# Patient Record
Sex: Female | Born: 1980 | Race: White | Hispanic: No | Marital: Married | Smoking: Current every day smoker
Health system: Southern US, Community
[De-identification: ages and names within clinical notes are randomized; demographics above are authoritative.]

## PROBLEM LIST (undated history)

## (undated) DIAGNOSIS — K259 Gastric ulcer, unspecified as acute or chronic, without hemorrhage or perforation: Secondary | ICD-10-CM

---

## 2007-06-12 HISTORY — PX: ABDOMINAL HYSTERECTOMY: SHX81

## 2012-05-11 ENCOUNTER — Emergency Department (HOSPITAL_COMMUNITY)
Admission: EM | Admit: 2012-05-11 | Discharge: 2012-05-11 | Disposition: A | Discharge status: Home / Self Care | Payer: No Typology Code available for payment source | Attending: Emergency Medicine | Admitting: Emergency Medicine

## 2012-05-11 ENCOUNTER — Encounter (HOSPITAL_COMMUNITY): Payer: Self-pay | Admitting: Emergency Medicine

## 2012-05-11 ENCOUNTER — Emergency Department (HOSPITAL_COMMUNITY): Payer: Self-pay

## 2012-05-11 ENCOUNTER — Emergency Department (HOSPITAL_COMMUNITY): Payer: No Typology Code available for payment source

## 2012-05-11 DIAGNOSIS — F17200 Nicotine dependence, unspecified, uncomplicated: Secondary | ICD-10-CM | POA: Insufficient documentation

## 2012-05-11 DIAGNOSIS — M542 Cervicalgia: Secondary | ICD-10-CM

## 2012-05-11 DIAGNOSIS — S0993XA Unspecified injury of face, initial encounter: Secondary | ICD-10-CM | POA: Insufficient documentation

## 2012-05-11 DIAGNOSIS — Y9389 Activity, other specified: Secondary | ICD-10-CM | POA: Insufficient documentation

## 2012-05-11 DIAGNOSIS — Y92410 Unspecified street and highway as the place of occurrence of the external cause: Secondary | ICD-10-CM | POA: Insufficient documentation

## 2012-05-11 DIAGNOSIS — S199XXA Unspecified injury of neck, initial encounter: Secondary | ICD-10-CM | POA: Insufficient documentation

## 2012-05-11 LAB — BASIC METABOLIC PANEL
BUN: 12 mg/dL (ref 6–23)
CO2: 27 mEq/L (ref 19–32)
Calcium: 9.6 mg/dL (ref 8.4–10.5)
Creatinine, Ser: 0.79 mg/dL (ref 0.50–1.10)
Glucose, Bld: 109 mg/dL — ABNORMAL HIGH (ref 70–99)

## 2012-05-11 LAB — CBC
HCT: 37.7 % (ref 36.0–46.0)
Hemoglobin: 12.9 g/dL (ref 12.0–15.0)
MCH: 31.7 pg (ref 26.0–34.0)
MCHC: 34.2 g/dL (ref 30.0–36.0)
MCV: 92.6 fL (ref 78.0–100.0)
Platelets: 197 K/uL (ref 150–400)
RBC: 4.07 MIL/uL (ref 3.87–5.11)
RDW: 12.4 % (ref 11.5–15.5)
WBC: 9.6 K/uL (ref 4.0–10.5)

## 2012-05-11 LAB — APTT: aPTT: 33 s (ref 24–37)

## 2012-05-11 LAB — BASIC METABOLIC PANEL WITH GFR
Chloride: 106 meq/L (ref 96–112)
GFR calc Af Amer: >90 mL/min (ref >90)
GFR calc non Af Amer: >90 mL/min (ref >90)
Potassium: 4.1 meq/L (ref 3.5–5.1)
Sodium: 141 meq/L (ref 135–145)

## 2012-05-11 LAB — PROTIME-INR
INR: 1.02 (ref 0.00–1.49)
Prothrombin Time: 13.3 s (ref 11.6–15.2)

## 2012-05-11 MED ORDER — ONDANSETRON 4 MG PO TBDP
4.0000 mg | ORAL_TABLET | Freq: Once | ORAL | Status: AC
  Administered 2012-05-11: 4 mg via ORAL
  Filled 2012-05-11: qty 1

## 2012-05-11 MED ORDER — DIAZEPAM 5 MG PO TABS
5.0000 mg | ORAL_TABLET | Freq: Once | ORAL | Status: AC
  Administered 2012-05-11: 5 mg via ORAL
  Filled 2012-05-11: qty 1

## 2012-05-11 MED ORDER — HYDROCODONE-ACETAMINOPHEN 5-325 MG PO TABS: 2.0000 | ORAL_TABLET | ORAL | Status: DC | PRN

## 2012-05-11 MED ORDER — MORPHINE SULFATE 4 MG/ML IJ SOLN
4.0000 mg | Freq: Once | INTRAMUSCULAR | Status: AC
  Administered 2012-05-11: 4 mg via INTRAVENOUS
  Filled 2012-05-11: qty 1

## 2012-05-11 MED ORDER — DIAZEPAM 5 MG PO TABS: 5.0000 mg | ORAL_TABLET | Freq: Once | ORAL | Status: DC
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
## 2015-05-15 ENCOUNTER — Encounter (HOSPITAL_COMMUNITY): Payer: Self-pay | Admitting: *Deleted

## 2015-05-15 ENCOUNTER — Emergency Department (HOSPITAL_COMMUNITY)
Admission: EM | Admit: 2015-05-15 | Discharge: 2015-05-15 | Disposition: A | Discharge status: Home / Self Care | Payer: No Typology Code available for payment source | Source: Home / Self Care | Attending: Emergency Medicine | Admitting: Emergency Medicine

## 2015-05-15 DIAGNOSIS — J029 Acute pharyngitis, unspecified: Secondary | ICD-10-CM

## 2015-05-15 DIAGNOSIS — R0982 Postnasal drip: Secondary | ICD-10-CM | POA: Diagnosis not present

## 2015-05-15 LAB — POCT RAPID STREP A: Streptococcus, Group A Screen (Direct): NEGATIVE

## 2015-05-15 MED ORDER — PREDNISONE 50 MG PO TABS: ORAL_TABLET | ORAL | Status: DC

## 2015-05-15 MED ORDER — IPRATROPIUM BROMIDE 0.06 % NA SOLN: 2.0000 | Freq: Four times a day (QID) | NASAL | Status: AC

## 2015-05-15 NOTE — ED Provider Notes (Signed)
CSN: [id number]     Arrival date & time 05/15/15  1522 History   First MD Initiated Contact with Patient 05/15/15 1601     Chief Complaint  Patient presents with  . Sore Throat   (Consider location/radiation/quality/duration/timing/severity/associated sxs/prior Treatment) HPI She is a 34 year old woman here for evaluation of sore throat. She states the sore throat started about a week ago. She thought it was some sinus drainage and treated with over-the-counter medications. The sore throat has persisted. 2 days ago she developed headaches and fevers. She also reports some nausea, but denies vomiting. She is able to eat and drink without difficulty. She reports some mild nasal congestion and rhinorrhea. She also reports a mild cough. She states she frequently gets strep throat.  History reviewed. No pertinent past medical history. Past Surgical History  Procedure Laterality Date  . Abdominal hysterectomy  2009    partial   History reviewed. No pertinent family history. Social History  Substance Use Topics  . Smoking status: Current Every Day Smoker -- 0.50 packs/day    Types: Cigarettes  . Smokeless tobacco: None  . Alcohol Use: No   OB History    No data available     Review of Systems As in history of present illness Allergies  Penicillins  Home Medications   Prior to Admission medications   Medication Sig Start Date End Date Taking? Authorizing Provider  ipratropium (ATROVENT) 0.06 % nasal spray Place 2 sprays into both nostrils 4 (four) times daily. 05/15/15   Charm RingsErin J Davidmichael Zarazua, MD  predniSONE (DELTASONE) 50 MG tablet Take 1 pill daily for 5 days. 05/15/15   Charm RingsErin J Mamadou Breon, MD   Meds Ordered and Administered this Visit  Medications - No data to display  BP 116/78 mmHg  Pulse 82  Temp(Src) 99.4 F (37.4 C) (Oral)  SpO2 98% No data found.   Physical Exam  Constitutional: She is oriented to person, place, and time. She appears well-developed and well-nourished.  HENT:   Mouth/Throat: No oropharyngeal exudate.  Oropharynx with mild erythema and clear drainage. There is some nasal discharge present.  Neck: Neck supple.  Cardiovascular: Normal rate, regular rhythm and normal heart sounds.   No murmur heard. Pulmonary/Chest: Effort normal and breath sounds normal. No respiratory distress. She has no wheezes. She has no rales.  Lymphadenopathy:    She has no cervical adenopathy.  Neurological: She is alert and oriented to person, place, and time.    ED Course  Procedures (including critical care time)  Labs Review Labs Reviewed  POCT RAPID STREP A    Imaging Review No results found.     MDM   1. Viral pharyngitis   2. PND (post-nasal drip)    Rapid strep negative. Prednisone to help with inflammation. Atrovent nasal spray to help with drainage. We'll call if culture is positive. Follow-up as needed.    Charm RingsErin J Jennilyn Esteve, MD 05/15/15 [phone]

## 2015-05-15 NOTE — Discharge Instructions (Signed)
There sore throat is likely a combination of a virus and postnasal drainage. Your rapid strep test was negative. We will call you if the culture is positive. Please take prednisone daily for 5 days to help with the inflammation. Use Atrovent nasal spray 4 times a day as needed for drainage. Follow-up as needed.

## 2015-05-15 NOTE — ED Notes (Signed)
Pt reports   Symptoms      Of   sorethroat      Headache        With    Symptoms      X   1   Week      -      Sinus  Drainage  Present             Pt  Sitting  Upright    On the  Exam table  In no  Acute  Distress

## 2015-05-17 LAB — CULTURE, GROUP A STREP: Strep A Culture: NEGATIVE

## 2015-05-17 NOTE — ED Notes (Signed)
Final report of strep testing negative  

## 2017-07-11 ENCOUNTER — Other Ambulatory Visit: Payer: Self-pay

## 2017-07-11 ENCOUNTER — Encounter (HOSPITAL_COMMUNITY): Payer: Self-pay | Admitting: Emergency Medicine

## 2017-07-11 ENCOUNTER — Ambulatory Visit (HOSPITAL_COMMUNITY)
Admission: EM | Admit: 2017-07-11 | Discharge: 2017-07-11 | Disposition: A | Discharge status: Home / Self Care | Payer: No Typology Code available for payment source | Attending: Family Medicine | Admitting: Family Medicine

## 2017-07-11 DIAGNOSIS — Z79899 Other long term (current) drug therapy: Secondary | ICD-10-CM | POA: Insufficient documentation

## 2017-07-11 DIAGNOSIS — F17210 Nicotine dependence, cigarettes, uncomplicated: Secondary | ICD-10-CM | POA: Insufficient documentation

## 2017-07-11 DIAGNOSIS — R21 Rash and other nonspecific skin eruption: Secondary | ICD-10-CM | POA: Insufficient documentation

## 2017-07-11 LAB — POCT RAPID STREP A: Streptococcus, Group A Screen (Direct): NEGATIVE

## 2017-07-11 MED ORDER — PREDNISONE 50 MG PO TABS: ORAL_TABLET | ORAL | 0 refills | Status: DC

## 2017-07-11 NOTE — Discharge Instructions (Addendum)
Rapid strep negative. Start prednisone as directed. You can continue benadryl or other over the counter medication such as zyrtec, claritin for the itching. Monitor for any new exposure that could be causing the symptoms. Follow up with PCP for further evaluation if symptoms not improving. If experiencing worsening symptoms, swelling of the throat, trouble breathing, trouble swallowing, go to the emergency department for further evaluation.

## 2017-07-11 NOTE — ED Triage Notes (Signed)
Pt c/o rash all over body, tried using OTC benadryl and creams, woke up with it worsening.
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
## 2017-07-13 LAB — CULTURE, GROUP A STREP (THRC)

## 2019-09-09 ENCOUNTER — Encounter (HOSPITAL_COMMUNITY): Payer: Self-pay

## 2019-09-09 ENCOUNTER — Ambulatory Visit (HOSPITAL_COMMUNITY)
Admission: EM | Admit: 2019-09-09 | Discharge: 2019-09-09 | Disposition: A | Discharge status: Home / Self Care | Payer: BC Managed Care – PPO | Attending: Family Medicine | Admitting: Family Medicine

## 2019-09-09 ENCOUNTER — Other Ambulatory Visit: Payer: Self-pay

## 2019-09-09 DIAGNOSIS — M25532 Pain in left wrist: Secondary | ICD-10-CM

## 2019-09-09 DIAGNOSIS — M25531 Pain in right wrist: Secondary | ICD-10-CM | POA: Diagnosis not present

## 2019-09-09 DIAGNOSIS — G5603 Carpal tunnel syndrome, bilateral upper limbs: Secondary | ICD-10-CM

## 2019-09-09 MED ORDER — PREDNISONE 10 MG (21) PO TBPK: ORAL_TABLET | Freq: Every day | ORAL | 0 refills | Status: AC

## 2019-09-09 MED ORDER — ONDANSETRON HCL 4 MG PO TABS: 4.0000 mg | ORAL_TABLET | Freq: Four times a day (QID) | ORAL | 0 refills | Status: AC

## 2019-09-09 NOTE — ED Provider Notes (Addendum)
[facility]    CSN: [id number] Arrival date & time: 09/09/19  5681      History   Chief Complaint Chief Complaint  Patient presents with  . Arm Pain    HPI Kelly Hull is a 39 y.o. female.   Reports increased pain with carpal tunnel.  Reports that she has been battling carpal tunnel for about 20 years.  Reports that she is having more pain on the left side than on the right but it is bilateral.  She reports that she has a new knot on the inner radial side of her wrist.  She states this is very tender.  She states that she tries to splint her wrist at night, but then her hands are so swollen in the morning that she has difficulty removing the wrist braces.  She also reports that she does not have an orthopedist or hand specialist, because she has out-of[place] insurance.  She reports that she has not taken anything at home to try to work to treat this.  Reports that she has stomach ulcers from ibuprofen and NSAIDs that she does not take these.  Reports that Tylenol makes her nauseous so she does not usually take this.  Denies headache, cough, shortness of breath, chills, body aches, chest tenderness, nausea, vomiting, diarrhea, rash, fever, other symptoms.  Per chart review, patient does not have significant medical history.  The history is provided by the patient.  Arm Pain Pertinent negatives include no chest pain, no abdominal pain, no headaches and no shortness of breath.    History reviewed. No pertinent past medical history.  There are no problems to display for this patient.   Past Surgical History:  Procedure Laterality Date  . ABDOMINAL HYSTERECTOMY  2009   partial    OB History   No obstetric history on file.      Home Medications    Prior to Admission medications   Medication Sig Start Date End Date Taking? Authorizing Provider  ipratropium (ATROVENT) 0.06 % nasal spray Place 2 sprays into both nostrils 4 (four) times daily. Patient not taking:  Reported on 07/11/2017 05/15/15   Charm Rings, MD  ondansetron (ZOFRAN) 4 MG tablet Take 1 tablet (4 mg total) by mouth every 6 (six) hours. 09/09/19   Moshe Cipro, NP  predniSONE (STERAPRED UNI-PAK 21 TAB) 10 MG (21) TBPK tablet Take by mouth daily for 6 days. Take 6 tablets on day 1, 5 tablets on day 2, 4 tablets on day 3, 3 tablets on day 4, 2 tablets on day 5, 1 tablet on day 6 09/09/19 09/15/19  Moshe Cipro, NP    Family History No family history on file.  Social History Social History   Tobacco Use  . Smoking status: Current Every Day Smoker    Packs/day: 0.50    Types: Cigars  . Smokeless tobacco: Never Used  . Tobacco comment: 6 cigars per day  Substance Use Topics  . Alcohol use: No  . Drug use: No     Allergies   Penicillins   Review of Systems Review of Systems  Constitutional: Negative for chills, fatigue and fever.  HENT: Negative for ear pain, postnasal drip and sore throat.   Eyes: Negative for pain and visual disturbance.  Respiratory: Negative for cough and shortness of breath.   Cardiovascular: Negative for chest pain and palpitations.  Gastrointestinal: Negative for abdominal pain and vomiting.  Genitourinary: Negative for dysuria and hematuria.  Musculoskeletal: Negative for arthralgias and back  pain.       Bilateral wrist pain  Skin: Negative for color change and rash.  Neurological: Negative for dizziness, tremors, seizures, syncope, speech difficulty, weakness, light-headedness, numbness and headaches.  Hematological: Negative.   Psychiatric/Behavioral: Negative.   All other systems reviewed and are negative.    Physical Exam Triage Vital Signs ED Triage Vitals [09/09/19 0836]  Enc Vitals Group     BP 116/77     Pulse Rate 87     Resp 17     Temp 98.2 F (36.8 C)     Temp Source Oral     SpO2 100 %     Weight      Height      Head Circumference      Peak Flow      Pain Score 8     Pain Loc      Pain Edu?      Excl. in  GC?    No data found.  Updated Vital Signs BP 116/77 (BP Location: Right Arm)   Pulse 87   Temp 98.2 F (36.8 C) (Oral)   Resp 17   SpO2 100%   Visual Acuity Right Eye Distance:   Left Eye Distance:   Bilateral Distance:    Right Eye Near:   Left Eye Near:    Bilateral Near:     Physical Exam Vitals and nursing note reviewed.  Constitutional:      General: She is not in acute distress.    Appearance: Normal appearance. She is well-developed and normal weight. She is not ill-appearing.  HENT:     Head: Normocephalic and atraumatic.     Nose: Nose normal.     Mouth/Throat:     Mouth: Mucous membranes are moist.     Pharynx: Oropharynx is clear.  Eyes:     Extraocular Movements: Extraocular movements intact.     Conjunctiva/sclera: Conjunctivae normal.     Pupils: Pupils are equal, round, and reactive to light.  Cardiovascular:     Rate and Rhythm: Normal rate and regular rhythm.     Heart sounds: Normal heart sounds. No murmur.  Pulmonary:     Effort: Pulmonary effort is normal. No respiratory distress.     Breath sounds: Normal breath sounds.  Abdominal:     General: Abdomen is flat. Bowel sounds are normal. There is no distension.     Palpations: Abdomen is soft. There is no mass.     Tenderness: There is no abdominal tenderness. There is no guarding or rebound.     Hernia: No hernia is present.  Musculoskeletal:        General: Tenderness present.     Right wrist: Swelling and tenderness present. Decreased range of motion.     Left wrist: Swelling and tenderness present. Decreased range of motion.       Arms:     Cervical back: Neck supple.     Comments: Area of cyst on left radial wrist in diagram  Skin:    General: Skin is warm and dry.     Capillary Refill: Capillary refill takes less than 2 seconds.  Neurological:     General: No focal deficit present.     Mental Status: She is alert and oriented to person, place, and time.  Psychiatric:        Mood  and Affect: Mood normal.        Behavior: Behavior normal.        Thought Content: Thought  content normal.      UC Treatments / Results  Labs (all labs ordered are listed, but only abnormal results are displayed) Labs Reviewed - No data to display  EKG   Radiology No results found.  Procedures Procedures (including critical care time)  Medications Ordered in UC Medications - No data to display  Initial Impression / Assessment and Plan / UC Course  I have reviewed the triage vital signs and the nursing notes.  Pertinent labs & imaging results that were available during my care of the patient were reviewed by me and considered in my medical decision making (see chart for details).     Presents with bilateral carpal tunnel syndrome with increased pain in both wrists.  Patient not taking any medications for this at this time.  Patient also not splinting her wrist while she is sleeping.  Prescribed 6-day Pred pack and patient instructed on how to take this medication.  Patient also prescribed Zofran that she may take with Tylenol as she complains that Tylenol makes her nauseated.  Instructed patient that she may have decreased nausea with eating and taking Tylenol.  Instructed patient that she may also use Voltaren gel over-the-counter since she does not tolerate NSAIDs orally.  Instructed to that she needs to follow-up with orthopedics or hand specialist. Final Clinical Impressions(s) / UC Diagnoses   Final diagnoses:  Bilateral carpal tunnel syndrome  Pain in both wrists     Discharge Instructions     Take the zofran with tylenol as needed.  Rest and elevate your hand.  Apply ice packs 2-3 times a day for up to 20 minutes each.  Wear the Ace wrap as needed for comfort.    Follow up with your primary care provider or an orthopedist if you symptoms continue or worsen;  Or if you develop new symptoms, such as numbness, tingling, or weakness.    May use Votlaren gel topically  as well.      ED Prescriptions    Medication Sig Dispense Auth. Provider   predniSONE (STERAPRED UNI-PAK 21 TAB) 10 MG (21) TBPK tablet Take by mouth daily for 6 days. Take 6 tablets on day 1, 5 tablets on day 2, 4 tablets on day 3, 3 tablets on day 4, 2 tablets on day 5, 1 tablet on day 6 21 tablet Faustino Congress, NP   ondansetron (ZOFRAN) 4 MG tablet Take 1 tablet (4 mg total) by mouth every 6 (six) hours. 12 tablet Faustino Congress, NP     I have reviewed the PDMP during this encounter.   Faustino Congress, NP 09/09/19 2836    Faustino Congress, NP 09/09/19 1013

## 2019-09-09 NOTE — Discharge Instructions (Signed)
Take the zofran with tylenol as needed.  Rest and elevate your hand.  Apply ice packs 2-3 times a day for up to 20 minutes each.  Wear the Ace wrap as needed for comfort.    Follow up with your primary care provider or an orthopedist if you symptoms continue or worsen;  Or if you develop new symptoms, such as numbness, tingling, or weakness.    May use Votlaren gel topically as well.

## 2019-09-09 NOTE — ED Triage Notes (Signed)
C/o left arm pain. Hx carpal tunnel syndrome. Pain started last night and kept patient awake all night. Unable to abduct left arm. Patient also reports two "knots" on left arm, one at the wrist and one at the elbow.

## 2020-06-19 ENCOUNTER — Encounter (HOSPITAL_COMMUNITY): Payer: Self-pay | Admitting: Emergency Medicine

## 2020-06-19 ENCOUNTER — Emergency Department (HOSPITAL_COMMUNITY)
Admission: EM | Admit: 2020-06-19 | Discharge: 2020-06-19 | Disposition: A | Discharge status: Home / Self Care | Payer: BC Managed Care – PPO | Attending: Emergency Medicine | Admitting: Emergency Medicine

## 2020-06-19 ENCOUNTER — Other Ambulatory Visit: Payer: Self-pay

## 2020-06-19 DIAGNOSIS — R109 Unspecified abdominal pain: Secondary | ICD-10-CM | POA: Diagnosis not present

## 2020-06-19 DIAGNOSIS — K254 Chronic or unspecified gastric ulcer with hemorrhage: Secondary | ICD-10-CM | POA: Diagnosis not present

## 2020-06-19 DIAGNOSIS — Z5321 Procedure and treatment not carried out due to patient leaving prior to being seen by health care provider: Secondary | ICD-10-CM | POA: Diagnosis not present

## 2020-06-19 DIAGNOSIS — R079 Chest pain, unspecified: Secondary | ICD-10-CM | POA: Diagnosis not present

## 2020-06-19 DIAGNOSIS — R110 Nausea: Secondary | ICD-10-CM | POA: Diagnosis not present

## 2020-06-19 HISTORY — DX: Gastric ulcer, unspecified as acute or chronic, without hemorrhage or perforation: K25.9

## 2020-06-19 LAB — COMPREHENSIVE METABOLIC PANEL
ALT: 44 U/L (ref 0–44)
AST: 31 U/L (ref 15–41)
Albumin: 3.9 g/dL (ref 3.5–5.0)
Alkaline Phosphatase: 53 U/L (ref 38–126)
Anion gap: 14 (ref 5–15)
BUN: 10 mg/dL (ref 6–20)
CO2: 24 mmol/L (ref 22–32)
Calcium: 10 mg/dL (ref 8.9–10.3)
Chloride: 99 mmol/L (ref 98–111)
Creatinine, Ser: 0.71 mg/dL (ref 0.44–1.00)
GFR, Estimated: >60 mL/min (ref >60)
Glucose, Bld: 107 mg/dL — ABNORMAL HIGH (ref 70–99)
Potassium: 4.1 mmol/L (ref 3.5–5.1)
Sodium: 137 mmol/L (ref 135–145)
Total Bilirubin: 0.1 mg/dL — ABNORMAL LOW (ref 0.3–1.2)
Total Protein: 6.9 g/dL (ref 6.5–8.1)

## 2020-06-19 LAB — LIPASE, BLOOD: Lipase: 29 U/L (ref 11–51)

## 2020-06-19 LAB — CBC
HCT: 45.9 % (ref 36.0–46.0)
Hemoglobin: 15.2 g/dL — ABNORMAL HIGH (ref 12.0–15.0)
MCH: 31.5 pg (ref 26.0–34.0)
MCHC: 33.1 g/dL (ref 30.0–36.0)
MCV: 95 fL (ref 80.0–100.0)
Platelets: 124 10*3/uL — ABNORMAL LOW (ref 150–400)
RBC: 4.83 MIL/uL (ref 3.87–5.11)
RDW: 12.4 % (ref 11.5–15.5)
WBC: 5.5 10*3/uL (ref 4.0–10.5)
nRBC: 0 % (ref 0.0–0.2)

## 2020-06-19 LAB — TROPONIN I (HIGH SENSITIVITY): Troponin I (High Sensitivity): 3 ng/L (ref <18)

## 2020-06-19 LAB — I-STAT BETA HCG BLOOD, ED (MC, WL, AP ONLY): I-stat hCG, quantitative: <5 m[IU]/mL (ref <5)

## 2020-06-19 NOTE — ED Notes (Signed)
Pt called 3x no response  
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
